# Patient Record
Sex: Female | Born: 1975
Health system: Southern US, Community
[De-identification: ages and names within clinical notes are randomized; demographics above are authoritative.]

## PROBLEM LIST (undated history)

## (undated) HISTORY — PX: ABDOMINAL HYSTERECTOMY: SHX81

---

## 2004-08-26 ENCOUNTER — Emergency Department: Payer: Self-pay | Admitting: Emergency Medicine

## 2005-11-25 ENCOUNTER — Emergency Department: Payer: Self-pay | Admitting: Emergency Medicine

## 2006-04-11 ENCOUNTER — Emergency Department: Payer: Self-pay | Admitting: Emergency Medicine

## 2007-03-09 ENCOUNTER — Emergency Department: Payer: Self-pay | Admitting: Emergency Medicine

## 2007-07-31 ENCOUNTER — Emergency Department: Payer: Self-pay | Admitting: Emergency Medicine

## 2007-11-23 ENCOUNTER — Emergency Department: Payer: Self-pay | Admitting: Emergency Medicine

## 2008-01-08 ENCOUNTER — Emergency Department: Payer: Self-pay | Admitting: Emergency Medicine

## 2008-03-15 ENCOUNTER — Emergency Department: Payer: Self-pay | Admitting: Emergency Medicine

## 2008-07-11 ENCOUNTER — Emergency Department: Payer: Self-pay | Admitting: Emergency Medicine

## 2009-05-05 ENCOUNTER — Emergency Department: Payer: Self-pay | Admitting: Emergency Medicine

## 2009-10-04 ENCOUNTER — Emergency Department: Payer: Self-pay | Admitting: Emergency Medicine

## 2010-04-03 ENCOUNTER — Emergency Department: Payer: Self-pay | Admitting: Emergency Medicine

## 2011-01-05 ENCOUNTER — Emergency Department (HOSPITAL_COMMUNITY)
Admission: EM | Admit: 2011-01-05 | Discharge: 2011-01-05 | Disposition: A | Discharge status: Home / Self Care | Payer: No Typology Code available for payment source | Attending: Emergency Medicine | Admitting: Emergency Medicine

## 2011-01-05 ENCOUNTER — Emergency Department (HOSPITAL_COMMUNITY): Payer: No Typology Code available for payment source

## 2011-01-05 DIAGNOSIS — M545 Low back pain, unspecified: Secondary | ICD-10-CM | POA: Insufficient documentation

## 2011-01-05 DIAGNOSIS — M542 Cervicalgia: Secondary | ICD-10-CM | POA: Insufficient documentation

## 2011-01-05 DIAGNOSIS — M25559 Pain in unspecified hip: Secondary | ICD-10-CM | POA: Insufficient documentation

## 2011-01-05 DIAGNOSIS — M546 Pain in thoracic spine: Secondary | ICD-10-CM | POA: Insufficient documentation

## 2011-01-05 DIAGNOSIS — N949 Unspecified condition associated with female genital organs and menstrual cycle: Secondary | ICD-10-CM | POA: Insufficient documentation

## 2011-03-20 ENCOUNTER — Emergency Department: Payer: Self-pay | Admitting: Emergency Medicine

## 2012-03-10 ENCOUNTER — Emergency Department: Payer: Self-pay | Admitting: Emergency Medicine

## 2013-01-16 ENCOUNTER — Emergency Department: Payer: Self-pay | Admitting: Emergency Medicine

## 2013-01-16 LAB — URINALYSIS, COMPLETE
Bilirubin,UR: NEGATIVE
Glucose,UR: NEGATIVE mg/dL (ref 0–75)
Ketone: NEGATIVE
Nitrite: NEGATIVE
RBC,UR: 1 /HPF (ref 0–5)
Squamous Epithelial: 16
WBC UR: 5 /HPF (ref 0–5)

## 2013-01-29 ENCOUNTER — Emergency Department: Payer: Self-pay | Admitting: Internal Medicine

## 2013-03-26 ENCOUNTER — Emergency Department: Payer: Self-pay | Admitting: Emergency Medicine

## 2013-03-26 LAB — CBC
HGB: 5.9 g/dL — ABNORMAL LOW (ref 12.0–16.0)
MCHC: 29.1 g/dL — ABNORMAL LOW (ref 32.0–36.0)
Platelet: 361 10*3/uL (ref 150–440)
RDW: 18.3 % — ABNORMAL HIGH (ref 11.5–14.5)
WBC: 5.5 10*3/uL (ref 3.6–11.0)

## 2013-03-26 LAB — COMPREHENSIVE METABOLIC PANEL
Alkaline Phosphatase: 63 U/L (ref 50–136)
Anion Gap: 4 — ABNORMAL LOW (ref 7–16)
Calcium, Total: 8.7 mg/dL (ref 8.5–10.1)
Co2: 25 mmol/L (ref 21–32)
Creatinine: 0.9 mg/dL (ref 0.60–1.30)
EGFR (African American): >60
EGFR (Non-African Amer.): >60
Glucose: 92 mg/dL (ref 65–99)
Potassium: 4 mmol/L (ref 3.5–5.1)
SGPT (ALT): 34 U/L (ref 12–78)
Total Protein: 7.5 g/dL (ref 6.4–8.2)

## 2013-03-26 LAB — WET PREP, GENITAL

## 2013-03-26 LAB — GC/CHLAMYDIA PROBE AMP

## 2013-03-26 LAB — HCG, QUANTITATIVE, PREGNANCY: Beta Hcg, Quant.: <1 m[IU]/mL — ABNORMAL LOW

## 2013-03-27 LAB — CBC WITH DIFFERENTIAL/PLATELET
Basophil %: 0.6 %
HCT: 24 % — ABNORMAL LOW (ref 35.0–47.0)
HGB: 7.5 g/dL — ABNORMAL LOW (ref 12.0–16.0)
Lymphocyte %: 20.7 %
MCH: 20.6 pg — ABNORMAL LOW (ref 26.0–34.0)
MCV: 66 fL — ABNORMAL LOW (ref 80–100)
Neutrophil #: 5 10*3/uL (ref 1.4–6.5)
Neutrophil %: 69 %
RDW: 22.3 % — ABNORMAL HIGH (ref 11.5–14.5)

## 2013-03-27 LAB — COMPREHENSIVE METABOLIC PANEL
Alkaline Phosphatase: 59 U/L (ref 50–136)
Anion Gap: 7 (ref 7–16)
BUN: 8 mg/dL (ref 7–18)
Co2: 27 mmol/L (ref 21–32)
EGFR (Non-African Amer.): >60
Glucose: 107 mg/dL — ABNORMAL HIGH (ref 65–99)
Osmolality: 280 (ref 275–301)
Potassium: 4.1 mmol/L (ref 3.5–5.1)
SGOT(AST): 18 U/L (ref 15–37)
SGPT (ALT): 29 U/L (ref 12–78)
Sodium: 141 mmol/L (ref 136–145)
Total Protein: 7.3 g/dL (ref 6.4–8.2)

## 2013-03-28 ENCOUNTER — Inpatient Hospital Stay: Payer: Self-pay | Admitting: Obstetrics and Gynecology

## 2013-03-28 LAB — CBC WITH DIFFERENTIAL/PLATELET
Lymphocyte #: 1 10*3/uL (ref 1.0–3.6)
Lymphocyte %: 17 %
MCV: 67 fL — ABNORMAL LOW (ref 80–100)
Neutrophil #: 4.2 10*3/uL (ref 1.4–6.5)
Platelet: 281 10*3/uL (ref 150–440)
RDW: 23 % — ABNORMAL HIGH (ref 11.5–14.5)

## 2013-03-29 LAB — CBC WITH DIFFERENTIAL/PLATELET
Eosinophil #: 0.3 10*3/uL (ref 0.0–0.7)
Eosinophil %: 5.2 %
HGB: 8.1 g/dL — ABNORMAL LOW (ref 12.0–16.0)
Lymphocyte #: 1.3 10*3/uL (ref 1.0–3.6)
MCHC: 32.3 g/dL (ref 32.0–36.0)
MCV: 70 fL — ABNORMAL LOW (ref 80–100)
Platelet: 276 10*3/uL (ref 150–440)
RBC: 3.61 10*6/uL — ABNORMAL LOW (ref 3.80–5.20)
WBC: 6.4 10*3/uL (ref 3.6–11.0)

## 2013-04-01 LAB — PATHOLOGY REPORT

## 2013-05-01 ENCOUNTER — Ambulatory Visit: Payer: Self-pay | Admitting: Obstetrics and Gynecology

## 2013-05-01 LAB — CBC
HCT: 31.8 % — ABNORMAL LOW (ref 35.0–47.0)
HGB: 10.3 g/dL — ABNORMAL LOW (ref 12.0–16.0)
MCHC: 32.4 g/dL (ref 32.0–36.0)
RBC: 4.24 10*6/uL (ref 3.80–5.20)
WBC: 5.4 10*3/uL (ref 3.6–11.0)

## 2013-05-01 LAB — BASIC METABOLIC PANEL
BUN: 10 mg/dL (ref 7–18)
Calcium, Total: 9.3 mg/dL (ref 8.5–10.1)
Co2: 30 mmol/L (ref 21–32)

## 2013-05-05 ENCOUNTER — Inpatient Hospital Stay: Payer: Self-pay | Admitting: Obstetrics and Gynecology

## 2013-05-06 LAB — CREATININE, SERUM: EGFR (African American): >60

## 2013-05-07 LAB — PATHOLOGY REPORT

## 2013-05-08 LAB — CBC WITH DIFFERENTIAL/PLATELET
Basophil #: 0 10*3/uL (ref 0.0–0.1)
Basophil %: 0.4 %
Eosinophil %: 4.1 %
HCT: 21 % — ABNORMAL LOW (ref 35.0–47.0)
HGB: 6.9 g/dL — ABNORMAL LOW (ref 12.0–16.0)
Lymphocyte %: 21.9 %
MCV: 78 fL — ABNORMAL LOW (ref 80–100)
Monocyte #: 0.3 x10 3/mm (ref 0.2–0.9)
Monocyte %: 6.6 %
Neutrophil #: 3.2 10*3/uL (ref 1.4–6.5)
Platelet: 230 10*3/uL (ref 150–440)

## 2013-08-25 ENCOUNTER — Emergency Department: Payer: Self-pay | Admitting: Emergency Medicine

## 2013-08-26 LAB — CBC
HGB: 11.3 g/dL — ABNORMAL LOW (ref 12.0–16.0)
MCH: 24.5 pg — ABNORMAL LOW (ref 26.0–34.0)
MCV: 76 fL — ABNORMAL LOW (ref 80–100)
Platelet: 293 10*3/uL (ref 150–440)
RBC: 4.62 10*6/uL (ref 3.80–5.20)
RDW: 19.3 % — ABNORMAL HIGH (ref 11.5–14.5)
WBC: 6 10*3/uL (ref 3.6–11.0)

## 2013-08-26 LAB — BASIC METABOLIC PANEL
Calcium, Total: 8.9 mg/dL (ref 8.5–10.1)
Co2: 26 mmol/L (ref 21–32)
Creatinine: 0.82 mg/dL (ref 0.60–1.30)
EGFR (African American): >60
EGFR (Non-African Amer.): >60
Glucose: 88 mg/dL (ref 65–99)
Osmolality: 272 (ref 275–301)
Potassium: 3.7 mmol/L (ref 3.5–5.1)

## 2014-01-25 ENCOUNTER — Encounter (HOSPITAL_COMMUNITY): Payer: Self-pay | Admitting: Emergency Medicine

## 2014-01-25 ENCOUNTER — Emergency Department (HOSPITAL_COMMUNITY): Payer: Medicaid Other

## 2014-01-25 ENCOUNTER — Emergency Department (HOSPITAL_COMMUNITY)
Admission: EM | Admit: 2014-01-25 | Discharge: 2014-01-25 | Disposition: A | Discharge status: Home / Self Care | Payer: Medicaid Other | Attending: Emergency Medicine | Admitting: Emergency Medicine

## 2014-01-25 DIAGNOSIS — M25562 Pain in left knee: Secondary | ICD-10-CM

## 2014-01-25 DIAGNOSIS — M1990 Unspecified osteoarthritis, unspecified site: Secondary | ICD-10-CM

## 2014-01-25 DIAGNOSIS — M1710 Unilateral primary osteoarthritis, unspecified knee: Secondary | ICD-10-CM | POA: Insufficient documentation

## 2014-01-25 DIAGNOSIS — IMO0002 Reserved for concepts with insufficient information to code with codable children: Secondary | ICD-10-CM | POA: Insufficient documentation

## 2014-01-25 MED ORDER — IBUPROFEN 600 MG PO TABS: 600.0000 mg | ORAL_TABLET | Freq: Four times a day (QID) | ORAL | Status: DC | PRN

## 2014-01-25 NOTE — ED Notes (Addendum)
Pt c/o lt knee pain x 1 year.  Worse x 1 month.  Denies injury.

## 2014-01-25 NOTE — ED Provider Notes (Signed)
CSN: [id number]     Arrival date & time 01/25/14  1453 History  This chart was scribed for non-physician practitioner, Christine MuttonMarissa Tanasia Budzinski, PA-C working with Christine KrasJon R Knapp, MD by Christine Li, ED scribe. This patient was seen in room WTR6/WTR6 and the patient's care was started at 5:46 PM.   Chief Complaint  Patient presents with  . Knee Pain   The history is provided by the patient. No language interpreter was used.   HPI Comments: Christine Li is a 38 y.o. female who presents to the Emergency Department complaining of gradual onset, stabbing left knee pain that started a little over one year ago. States pain worsened one month ago. Rates pain 5/10 currently. Pt states it intermittently radiates down her leg. 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 States she wears shoes with good arches. Bearing weight worsens the pain. She has tried tylenol, inserts, stretching, ice and warm compresses with little relief. Pt is also having some right knee pain but states it is not as bad as the left. Denies numbness or tingling, loss of sensation.   History reviewed. No pertinent past medical history. Past Surgical History  Procedure Laterality Date  . Abdominal hysterectomy     History reviewed. No pertinent family history. History  Substance Use Topics  . Smoking status: Never Smoker   . Smokeless tobacco: Not on file  . Alcohol Use: No   OB History   Grav Para Term Preterm Abortions TAB SAB Ect Mult Living                 Review of Systems  Musculoskeletal: Positive for arthralgias.  Neurological: Negative for numbness.  All other systems reviewed and are negative.  Allergies  Review of patient's allergies indicates no known allergies.  Home Medications   Prior to Admission medications   Not on File   BP 141/85  Pulse 101  Temp(Src) 98.2 F (36.8 C) (Oral)  Resp 16  SpO2 96%  LMP 12/31/2010  Physical Exam  Nursing note and vitals reviewed. Constitutional: She is  oriented to person, place, and time. She appears well-developed and well-nourished. No distress.  HENT:  Head: Normocephalic and atraumatic.  Eyes: Conjunctivae and EOM are normal. Right eye exhibits no discharge. Left eye exhibits no discharge.  Neck: Normal range of motion. Neck supple. No tracheal deviation present.  Cardiovascular: Normal rate, regular rhythm and normal heart sounds.   Pulses:      Radial pulses are 2+ on the right side, and 2+ on the left side.       Dorsalis pedis pulses are 2+ on the right side, and 2+ on the left side.       Posterior tibial pulses are 2+ on the right side, and 2+ on the left side.  Pulmonary/Chest: Effort normal and breath sounds normal. No respiratory distress.  Musculoskeletal: She exhibits edema and tenderness.       Legs: Mild swelling identified to the medial aspect of the left knee. Negative erythema, inflammation, lesions, sores, deformities identified. Discomfort upon palpation to the medial aspect of the left knee - following the MCL. Full range of motion-full flexion extension identified with most discomfort upon flexion of the knee. Negative posterior and anterior draw sign. Discomfort with McMurray's sign. Negative valgus and varus tension noted. Stable left knee joint.  Lymphadenopathy:    She has no cervical adenopathy.  Neurological: She is alert and oriented to person, place, and time. No cranial nerve deficit.  She exhibits normal muscle tone. Coordination normal.  Cranial nerves III-XII grossly intact Strength 5+/5+ to lower extremities bilaterally with resistance applied, equal distribution noted Strength intact to the digits of the feet bilaterally Sensation intact with differentiation to sharp and dull type Gait proper, proper balance - negative sway, negative drift, negative step-offs  Skin: Skin is warm and dry. She is not diaphoretic.  Psychiatric: She has a normal mood and affect. Her behavior is normal.    ED Course   Procedures (including critical care time)  DIAGNOSTIC STUDIES: Oxygen Saturation is 96% on RA, normal by my interpretation.    COORDINATION OF CARE: 5:53 PM-Discussed treatment plan which includes an anti-inflammatory with pt at bedside and pt agreed to plan. Will give pt orthopedic referral and advised her to follow up.  Labs Review Labs Reviewed - No data to display  Imaging Review Dg Knee Complete 4 Views Left  01/25/2014   CLINICAL DATA:  Chronic left knee pain  EXAM: LEFT KNEE - COMPLETE 4+ VIEW  COMPARISON:  None.  FINDINGS: No fracture. No bone lesion. Minimal marginal osteophytes are noted from the medial compartment. No other arthropathic change. No joint effusion. Normal soft tissues.  IMPRESSION: Minimal medial joint space compartment marginal osteophytes. No other abnormality.   Electronically Signed   By: Christine Li  Li M.D.   On: 01/25/2014 15:08     EKG Interpretation None      MDM   Final diagnoses:  Osteoarthritis  Left knee pain    Filed Vitals:   01/25/14 1507  BP: 141/85  Pulse: 101  Temp: 98.2 F (36.8 C)  TempSrc: Oral  Resp: 16  SpO2: 96%   I personally performed the services described in this documentation, which was scribed in my presence. The recorded information has been reviewed and is accurate.  Plain film of left knee identified minimal medial joint space compartment marginal osteophytes suspicion for osteoarthritis with no other acute abnormalities. Medial swelling identified to the left knee with mild discomfort upon palpation to the MCL joint line. Full flexion extension noted with decreased flexion secondary to pain. Sensation intact. Patient neurovascularly intact. Pulses palpable and strong-DP and PT. Strength intact to the digits. Patient is able to ambulate well. Negative acute abnormalities identified on plain film. Patient stable, afebrile. Patient no acute distress. Discharged patient. Patient placed in Ace wrap for comfort. Discussed  with patient educated patient on osteoarthritis. Referred patient to health and wellness Center and orthopedics. Discussed with patient weight-bearing exercises and water therapy. Discussed with patient rest, ice, elevate, massage. Discussed with patient proper insoles for her shoes. Discussed with patient to closely monitor symptoms and if symptoms are to worsen or change to report back to the ED - strict return instructions given.  Patient agreed to plan of care, understood, all questions answered.   Christine MuttonMarissa Ivan Lacher, PA-C 01/25/14 2115

## 2014-01-25 NOTE — Discharge Instructions (Signed)
Please call your doctor for a followup appointment within 24-48 hours. When you talk to your doctor please let them know that you were seen in the emergency department and have them acquire all of your records so that they can discuss the findings with you and formulate a treatment plan to fully care for your new and ongoing problems. Please call and set-up an appointment with orthopedics to be re-assessed regarding left knee pain and osteoarthritis Please wrap knee when working Please take medications on a full stomach - please no more than 2400 mg per day for this can lead to GI bleeds - please take on a full stomach Please participate in weight bearing exercises and water therapy to strengthen the muscles Please continue to monitor symptoms closely and if symptoms are to worsen or change (fever greater than 101, chills, nausea, vomiting, diarrhea, chest pain, shortness of breath, difficulty breathing, fall, injury, worsening or changes to pain pattern, numbness, tingling, loss of sensation to the leg, redness to the leg, redness to the knee, warmth upon palpation, red streaks running down the leg) please report back to the ED immediately  Osteoarthritis Osteoarthritis is a disease that causes soreness and swelling (inflammation) of a joint. It occurs when the cartilage at the affected joint wears down. Cartilage acts as a cushion, covering the ends of bones where they meet to form a joint. Osteoarthritis is the most common form of arthritis. It often occurs in older people. The joints affected most often by this condition include those in the:  Ends of the fingers.  Thumbs.  Neck.  Lower back.  Knees.  Hips. CAUSES  Over time, the cartilage that covers the ends of bones begins to wear away. This causes bone to rub on bone, producing pain and stiffness in the affected joints.  RISK FACTORS Certain factors can increase your chances of having osteoarthritis, including:  Older  age.  Excessive body weight.  Overuse of joints. SIGNS AND SYMPTOMS   Pain, swelling, and stiffness in the joint.  Over time, the joint may lose its normal shape.  Small deposits of bone (osteophytes) may grow on the edges of the joint.  Bits of bone or cartilage can break off and float inside the joint space. This may cause more pain and damage. DIAGNOSIS  Your health care provider will do a physical exam and ask about your symptoms. Various tests may be ordered, such as:  X-rays of the affected joint.  An MRI scan.  Blood tests to rule out other types of arthritis.  Joint fluid tests. This involves using a needle to draw fluid from the joint and examining the fluid under a microscope. TREATMENT  Goals of treatment are to control pain and improve joint function. Treatment plans may include:  A prescribed exercise program that allows for rest and joint relief.  A weight control plan.  Pain relief techniques, such as:  Properly applied heat and cold.  Electric pulses delivered to nerve endings under the skin (transcutaneous electrical nerve stimulation, TENS).  Massage.  Certain nutritional supplements.  Medicines to control pain, such as:  Acetaminophen.  Nonsteroidal anti-inflammatory drugs (NSAIDs), such as naproxen.  Narcotic or central-acting agents, such as tramadol.  Corticosteroids. These can be given orally or as an injection.  Surgery to reposition the bones and relieve pain (osteotomy) or to remove loose pieces of bone and cartilage. Joint replacement may be needed in advanced states of osteoarthritis. HOME CARE INSTRUCTIONS   Only take over-the-counter or prescription  medicines as directed by your health care provider. Take all medicines exactly as instructed.  Maintain a healthy weight. Follow your health care provider's instructions for weight control. This may include dietary instructions.  Exercise as directed. Your health care provider can  recommend specific types of exercise. These may include:  Strengthening exercises These are done to strengthen the muscles that support joints affected by arthritis. They can be performed with weights or with exercise bands to add resistance.  Aerobic activities These are exercises, such as brisk walking or low-impact aerobics, that get your heart pumping.  Range-of-motion activities These keep your joints limber.  Balance and agility exercises These help you maintain daily living skills.  Rest your affected joints as directed by your health care provider.  Follow up with your health care provider as directed. SEEK MEDICAL CARE IF:   Your skin turns red.  You develop a rash in addition to your joint pain.  You have worsening joint pain. SEEK IMMEDIATE MEDICAL CARE IF:  You have a significant loss of weight or appetite.  You have a fever along with joint or muscle aches.  You have night sweats. FOR MORE INFORMATION  National Institute of Arthritis and Musculoskeletal and Skin Diseases: www.niams.http://www.myers.net/nih.gov [employer] Millsational Institute on Aging: https://walker.com/www.nia.nih.gov American College of Rheumatology: www.rheumatology.org Document Released: 09/11/2005 Document Revised: 07/02/2013 Document Reviewed: 05/19/2013 [facility] Patient Information 2014 [place]Care, [place].   Emergency Department Resource Guide 1) Find a Doctor and Pay Out of Pocket Although you won't have to find out who is covered by your insurance plan, it is a good idea to ask around and get recommendations. You will then need to call the office and see if the doctor you have chosen will accept you as a new patient and what types of options they offer for patients who are self-pay. Some doctors offer discounts or will set up payment plans for their patients who do not have insurance, but you will need to ask so you aren't surprised when you get to your appointment.  2) Contact Your Local Health Department Not all health departments have  doctors that can see patients for sick visits, but many do, so it is worth a call to see if yours does. If you don't know where your local health department is, you can check in your phone book. The CDC also has a tool to help you locate your state's health department, and many state websites also have listings of all of their local health departments.  3) Find a Walk-in Clinic If your illness is not likely to be very severe or complicated, you may want to try a walk in clinic. These are popping up all over the country in pharmacies, drugstores, and shopping centers. They're usually staffed by nurse practitioners or physician assistants that have been trained to treat common illnesses and complaints. They're usually fairly quick and inexpensive. However, if you have serious medical issues or chronic medical problems, these are probably not your best option.  No Primary Care Doctor: - Call Health Connect at  [phone] - they can help you locate a primary care doctor that  accepts your insurance, provides certain services, etc. - Physician Referral Service- [phone]  Chronic Pain Problems: Organization         Address  Phone   Notes  [facility]  [phone] Patients need to be referred by their primary care doctor.   Medication Assistance: Retail buyerrganization         Address  Phone  Notes  [facility] Medication [facility] [address] [place], [place] [address] [phone] --Must be a resident of [facility] -- Must have NO insurance coverage whatsoever (no Medicaid/ Medicare, etc.) -- The pt. MUST have a primary care doctor that directs their care regularly and follows them in the community   [facility]ist  [phone]   [employer]  [phone]    Agencies that provide inexpensive medical care: Organization         Address  Phone   Notes  [place]  [phone]   [facility]    [phone]    [facility] [place], [place] [address] [phone]   [place] [address] [place] [phone]   Planned Parenthood    [phone]   [facility]    [phone]   [place] and [place] [address], [place] Phone:  [phone], Fax:  [phone] Hours of Operation:  9 am - 6 pm, M-F.  Also accepts Medicaid/Medicare and self-pay.  [facility] for [place], [address], [place] Phone: [phone], Fax: [phone]. Hours of Operation:  8:30 am - 5:30 pm, M-F.  Also accepts Medicaid and self-pay.  [facility] ([facility]) [place] [address] [place] Phone: [phone]   [place], [place], [place] [phone], Ext. [phone] Mondays & Thursdays: 7-9 AM.  First 15 patients are seen on a first come, first serve basis.    [place] Providers:  Organization         Address  Phone   Notes  [facility] [address],  [phone] Also accepts self-pay patients.  [facility] [address], [place]  [phone]   [place], [address], [place] [phone]   [facility] [address] [place] [address] [place] [phone]   Lucianne Lei [address], [place]   [phone] Only accepts [place] Access [place] patients after they have their name applied to their card.   Self-Pay (no insurance) in [facility]:  Organization         Address  Phone   Notes  Sickle Cell Patients, [facility] [place] [phone]   [facility] [place] [phone]   [facility] [place]  [place], [place], [place] [phone]   [facility]/Dr. Osei-Bonsu  [address], [place] or [address], [place] [phone] Phone number for both [place] and [place] locations is the same.  Urgent Medical and [facility] [address] [place] [phone]   [facility] [address], [place] or [address] [phone]   [facility] [address], [place] [phone], phone; [phone], fax Sees patients 1st and 3rd Saturday of every month.  Must not qualify for public or private insurance (i.e. Medicaid, Medicare, [place] Health Choice, Veterans' Benefits)  Household income should be no more than 200% of the poverty level The clinic cannot treat you if you are pregnant or think you are pregnant  Sexually transmitted  diseases are not treated at the clinic.    Dental Care: Organization         Address  Phone  Notes  [facility] Department of [facility] [place] [phone] Accepts children up to age 64 who are enrolled in [place] or [place]; pregnant women with a Medicaid card; and children who have applied for Medicaid or [place] Health Choice, but were declined, whose parents can pay a reduced fee at time of service.  [facility] Department of [facility]  [address], [place] [phone] Accepts children up to age 70 who are enrolled in [place] or [place]; pregnant women with a Medicaid card; and children who have applied for Medicaid or [place] Health Choice, but were declined, whose parents can pay a reduced fee at time of service.  [place] Adult Dental Access PROGRAM  [place] [phone] Patients are seen by appointment only. Walk-ins are not accepted. [place] will see patients 24 years of age and older. Monday - Tuesday (8am-5pm) Most Wednesdays (8:30-5pm) $30 per visit, cash only  [facility]  Adult Dental Access PROGRAM  [address], [facility] [phone] Patients are seen by appointment only. Walk-ins are not accepted. [place] will see patients 68 years of age and older. One Wednesday Evening (Monthly: Volunteer Based).  $30 per visit, cash only  [place]  [phone] for adults; Children under age 13, call Graduate Pediatric Dentistry at [phone]. Children aged 30-14, please call [phone] to request a pediatric application.  Dental services are provided in all areas of dental care including fillings, crowns and bridges, complete and partial dentures, implants, gum treatment, root canals, and extractions. Preventive care is also provided. Treatment is provided to both adults and children. Patients are selected via a lottery and there is often a waiting list.   [facility] [address], [place]  [phone] www.drcivils.com   [facility] [address] [place], [place] [phone], Ext. [phone] Second and Fourth Thursday of each month, opens at 6:30 AM; Clinic ends at 9 AM.  Patients are seen on a first-come first-served basis, and a limited number are seen during each clinic.   [facility]  [address] Hillard Danker [place], [place] [phone]   Eligibility Requirements You must have lived in [place], [place], or [place] counties for at least the last three months.   You cannot be eligible for state or federal sponsored [employer], including [employer], [place], or [employer].   You generally cannot be eligible for healthcare insurance through your employer.    How to apply: Eligibility screenings are held every Tuesday and Wednesday afternoon from 1:00 pm until 4:00 pm. You do not need an appointment for the interview!  [facility] [address] [place], [place]   [place]  [place] Department  [place]  [phone]    Behavioral Health Resources in the Community: Intensive Outpatient Programs Organization         Address  Phone  Notes  [place]. [address], [place], [place] [phone]   [facility] [address], [place], [place]   ADS: Alcohol & Drug Svcs [address] [place], [place]  [place] [address],  [place], [place] or [phone]   Substance Abuse Resources Organization         Address  Phone  Notes  Alcohol and Drug Services  [phone]   [place]  [phone]   [facility] [place]   Chinita Pester  [phone]   Residential & Outpatient Substance Abuse Program  [phone]   Psychological Services Organization         Address  Phone  Notes  [facility]  [place]  [phone]   [place] [address] [place] or [phone]    Mobile Crisis Teams Organization         Address  Phone  Notes  [facility], Mobile Crisis Care Unit  [phone]   Assertive Psychotherapeutic Services  [address] [place], [place]   [place] [address], [place] [phone]    Self-Help/Support Groups Organization         Address  Phone             Notes  [place]. of [place] - variety of support groups  [place] Call for more information  Narcotics Anonymous (NA), [facility] [address], [employer] [place]  2 meetings at this location   Special educational needs teacher         Address  Phone  Notes  ASAP Residential Treatment [place],    [place]  [phone]   [facility]  [address], [place] [phone], [place], [place]   [place], [place] [phone] Admissions: 8am-3pm M-F  Incentives Substance [place] [address],    [place], [place] [phone]   [facility] [address] [place], [place], [place]   [facility] [address],  [place], [place]   [facility] Programs - Intensive Outpatient [address], [place], [place]   [facility] ([place].) [place].,  [place], [place] [phone] or [phone]   [facility] Services (RTS) [address] [place], [place] Accepts Medicaid  [facility] [place] [address],  [place] [phone] Substance Abuse/Addiction Treatment   [facility] Organization         Address  Phone  Notes  [facility]  [phone]   Domenic Schwab, PhD [address] Arlis Port[place], [place]   [phone] or [phone]   [place], [place] [phone]   [facility] [address] [place], [place] [phone] Insurance/Medicaid/sponsorship through [facility] and Families [address], [address] [place]                                    [place], [place] [phone] [place] [address], [place] [phone]    Dr. Adele Schilder  [phone]   [facility] Dept. 1) [address],  2) [place] 3)  [place] [address], [place] [phone]  [phone]   [place] [phone] or [phone] (After Hours)

## 2014-01-25 NOTE — ED Provider Notes (Signed)
Medical screening examination/treatment/procedure(s) were performed by non-physician practitioner and as supervising physician I was immediately available for consultation/collaboration.    Celene KrasJon R Izaha Shughart, MD 01/25/14 [phone]
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
## 2015-01-15 NOTE — Op Note (Signed)
PATIENT NAME:  Christine Li, Christine Li MR#:  [id number] DATE OF BIRTH:  27-Feb-1976  DATE OF PROCEDURE:  03/27/2013  PREOPERATIVE DIAGNOSES: 1.  Menorrhagia to anemia.  2.  Abdominal pain.  3.  Uterine fibroids.   POSTOPERATIVE DIAGNOSES: 1.  Menorrhagia to anemia.  2.  Uterine fibroids, 22 weeks size.  3.  Abdominal pain.   OPERATIVE PROCEDURES: 1.  Hysteroscopy.  2.  Dilation and curettage of endometrium.   SURGEON: Herold HarmsMartin A DeFrancesco, MD   FIRST ASSISTANT: Lanier EnsignMatt Reinblatt, PA student   ANESTHESIA: General.   INDICATIONS: The patient is a 39 year old African American female para 0-1-7-1, uncertain last menstrual period, status post BTL for contraception, who presents for surgical evaluation and management of multifibroid uterus with menorrhagia to anemia. The patient was admitted to the Emergency Room following a 2-unit transfusion yesterday. She continues to bleed and have pain.   FINDINGS AT SURGERY: Revealed a multifibroid uterus that measured approximately 22 weeks size with a right-sided prominence. The uterus sounded to 11 cm. The uterus was mobile. On hysteroscopy, the cavity was distorted due to multiple nodular fibroids likely consistent with submucosal fibroids. A moderate amount of bleeding was encountered.   DESCRIPTION OF PROCEDURE: The patient was brought to the operating room where she was placed in the supine position. General anesthesia was induced without difficulty. She was placed in dorsal lithotomy position using candy cane stirrups. A Betadine perineal intravaginal prep and drape was performed in the standard fashion. A red Robinson catheter was used to drain 100 mL of urine from the bladder. A weighted speculum was placed into the vagina, and a double-tooth tenaculum was placed in the anterior lip of the cervix. The uterus was sounded to 11 cm. The Hanks dilators up to a #20-French caliber were used to dilate the endocervical canal. The [facility] hysteroscope using lactated  Ringer's as irrigant was used to attempt identification of the intrauterine environment. The cavity was distorted then, and no normal findings could be identified. A mild-to-moderate amount of bleeding obscured the field as well.   The curettage was then performed with both smooth and serrated curettes. Average tissue was obtained. Upon completion of the procedure, repeat hysteroscopy was performed, and again poor visualization was noted. The procedure was terminated with all instrumentation being removed from the vagina. The patient was then awakened, mobilized, and taken to the recovery room in satisfactory condition.   ESTIMATED BLOOD LOSS: 100 mL.  COMPLICATIONS:  None.   COUNTS:  All instruments, needle, and sponge counts were verified as correct.   ____________________________ Prentice DockerMartin A. DeFrancesco, MD mad:cb D: 03/27/2013 15:10:05 ET T: 03/27/2013 20:40:19 ET JOB#: [id number]  cc: Daphine DeutscherMartin A. DeFrancesco, MD, <Dictator> Prentice DockerMARTIN A DEFRANCESCO MD ELECTRONICALLY SIGNED 04/06/2013 22:58

## 2015-01-15 NOTE — Op Note (Signed)
PATIENT NAME:  Christine Li, Christine Li MR#:  [id number] DATE OF BIRTH:  01-10-1976  DATE OF PROCEDURE:  05/05/2013  PREOPERATIVE DIAGNOSES:  1.  Central pelvic mass.  2.  Menorrhagia.   OPERATIVE PROCEDURE:  Total abdominal hysterectomy and bilateral salpingectomy.   SURGEON: Herold Harms, M.D.   FIRST ASSISTANT: Earlie Lou, FNP.   ANESTHESIA: General endotracheal.   INDICATIONS: The patient is a 39 year old African American female, para 0-1-7-1, status post BTL, with a long history of menorrhagia to anemia requiring multiple blood transfusions, presents for surgical management. The patient did not undergo Lupron therapy for reduction in size due to cost. The patient desired definitive surgery.   FINDINGS AT SURGERY: A 23-week-sized multifibroid uterus. The ovaries were normal bilaterally. Tubes were normal, but there were adhesions that prompted bilateral salpingectomy.   DESCRIPTION OF PROCEDURE: The patient was brought to the operating room where she was placed in the supine position. General endotracheal anesthesia was induced without difficulty. A ChloraPrep and Betadine abdominal, perineal, intravaginal prep and drape was performed in the standard fashion. A Foley catheter was placed and was draining clear yellow urine.   A midline incision was made in the abdomen from the subxiphoid region around the umbilicus to the suprapubic region. This was extended to the fascia which was incised. The fascia was incised and the peritoneum was entered. The abdomen was explored and was notable for the above findings. The uterus could not be adequately mobilized and delivered from the pelvis and the hysterectomy had to be performed in situ. The size of the uterus precluded placement of the Balfour retractor. Using Richardson retractors, the hysterectomy was performed in somewhat standard fashion. The round ligaments were clamped, cut, and stick-tied using 0 Vicryl suture. This was done  bilaterally. The posterior leaf and anterior leaf of the broad ligaments were opened. The utero-ovarian ligaments were then undermined and doubly clamped and cut. These were stick-tied using 0 Vicryl suture. This was done bilaterally. The pedicle on the left was large and very vascular and this had to be taken in several bites. Similarly, the procedure was done on the right side as well. The broad ligament was skeletonized and the uterine arteries were doubly clamped and cut and stick-tied. Several other pedicles were taken on both sides, respectively, before the uterus could be amputated from the lower uterine segment. This was done with a scalpel.   This facilitated further exposure into the pelvis and the Balfour retractor was then able to be placed to provide adequate exposure. The remainder of the procedure was then performed in a sequential manner as the cardinal and broad ligament complexes were clamped, cut, and stick-tied using 0 Vicryl suture. This was carried down to the depths of the pelvis. The cervix was very long. Eventually the cervicovaginal junction was crossclamped, cut, and the cervix was removed from the operative field. The angles were closed using 0 Vicryl suture in a Richardson stitch technique. The vagina was closed with figure-of-eight sutures of 0 Vicryl.   Copious irrigation of the pelvis was performed. Hemostasis was noted to be good. Bilateral tubes were then removed because of adhesions from previous salpingectomies. The medial salpinx were crossclamped and cut and tied off using 0 Vicryl suture. Upon completion of the bilateral salpingectomies and further assessment of adequacy of hemostasis, the abdomen was closed in layers using 0 Maxon on the fascia in a simple running manner. Subcutaneous tissues were reapproximated using 0 Vicryl suture. The skin was closed with staples. A pressure  dressing was applied. The patient was then mobilized, awakened, and taken to the recovery room  in satisfactory condition.   ESTIMATED BLOOD LOSS: Was 500 mL.   IV FLUIDS: Two liters.   URINE OUTPUT: Was 90 mL.   The patient did receive Ancef antibiotic prophylaxis. The patient also had subQ heparin DVT prophylaxis as well.    ____________________________ Christine DockerMartin A. Erynn Vaca, MD mad:np D: 05/05/2013 20:04:23 ET T: 05/05/2013 20:56:44 ET JOB#: [id number]  cc: Christine DeutscherMartin A. Ibraheem Voris, MD, <Dictator> Christine DockerMARTIN A Jaselynn Tamas MD ELECTRONICALLY SIGNED 05/12/2013 8:30

## 2015-05-27 ENCOUNTER — Emergency Department (HOSPITAL_COMMUNITY)
Admission: EM | Admit: 2015-05-27 | Discharge: 2015-05-27 | Disposition: A | Discharge status: Home / Self Care | Payer: Medicaid Other

## 2015-05-27 NOTE — ED Notes (Signed)
Pt not in room to triage.

## 2015-05-27 NOTE — ED Notes (Signed)
Pt not in triage room, not in waiting area, not in bathrooms.

## 2015-05-27 NOTE — ED Notes (Signed)
Pt unable to be located 

## 2015-06-07 ENCOUNTER — Encounter: Payer: Self-pay | Admitting: Emergency Medicine

## 2015-06-07 ENCOUNTER — Emergency Department
Admission: EM | Admit: 2015-06-07 | Discharge: 2015-06-07 | Disposition: A | Discharge status: Home / Self Care | Payer: No Typology Code available for payment source | Attending: Emergency Medicine | Admitting: Emergency Medicine

## 2015-06-07 DIAGNOSIS — Y92410 Unspecified street and highway as the place of occurrence of the external cause: Secondary | ICD-10-CM | POA: Insufficient documentation

## 2015-06-07 DIAGNOSIS — Y998 Other external cause status: Secondary | ICD-10-CM | POA: Insufficient documentation

## 2015-06-07 DIAGNOSIS — S3992XA Unspecified injury of lower back, initial encounter: Secondary | ICD-10-CM | POA: Diagnosis present

## 2015-06-07 DIAGNOSIS — Y9389 Activity, other specified: Secondary | ICD-10-CM | POA: Diagnosis not present

## 2015-06-07 DIAGNOSIS — S39012A Strain of muscle, fascia and tendon of lower back, initial encounter: Secondary | ICD-10-CM | POA: Diagnosis not present

## 2015-06-07 MED ORDER — IBUPROFEN 800 MG PO TABS
800.0000 mg | ORAL_TABLET | Freq: Once | ORAL | Status: AC
  Administered 2015-06-07: 800 mg via ORAL
  Filled 2015-06-07: qty 1

## 2015-06-07 MED ORDER — BACLOFEN 10 MG PO TABS: 10.0000 mg | ORAL_TABLET | Freq: Three times a day (TID) | ORAL | Status: DC

## 2015-06-07 MED ORDER — BUTALBITAL-APAP-CAFFEINE 50-325-40 MG PO TABS
1.0000 | ORAL_TABLET | Freq: Once | ORAL | Status: AC
  Administered 2015-06-07: 1 via ORAL
  Filled 2015-06-07: qty 1

## 2015-06-07 MED ORDER — IBUPROFEN 800 MG PO TABS: 800.0000 mg | ORAL_TABLET | Freq: Three times a day (TID) | ORAL | Status: AC | PRN
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
## 2015-06-07 NOTE — Discharge Instructions (Signed)

## 2015-09-08 ENCOUNTER — Emergency Department: Payer: Medicaid Other

## 2015-09-08 ENCOUNTER — Encounter: Payer: Self-pay | Admitting: Emergency Medicine

## 2015-09-08 ENCOUNTER — Emergency Department
Admission: EM | Admit: 2015-09-08 | Discharge: 2015-09-08 | Disposition: A | Discharge status: Home / Self Care | Payer: Medicaid Other | Attending: Emergency Medicine | Admitting: Emergency Medicine

## 2015-09-08 DIAGNOSIS — Z79899 Other long term (current) drug therapy: Secondary | ICD-10-CM | POA: Insufficient documentation

## 2015-09-08 DIAGNOSIS — Y998 Other external cause status: Secondary | ICD-10-CM | POA: Insufficient documentation

## 2015-09-08 DIAGNOSIS — S6000XA Contusion of unspecified finger without damage to nail, initial encounter: Secondary | ICD-10-CM

## 2015-09-08 DIAGNOSIS — S60031A Contusion of right middle finger without damage to nail, initial encounter: Secondary | ICD-10-CM | POA: Insufficient documentation

## 2015-09-08 DIAGNOSIS — X58XXXA Exposure to other specified factors, initial encounter: Secondary | ICD-10-CM | POA: Insufficient documentation

## 2015-09-08 DIAGNOSIS — Y9289 Other specified places as the place of occurrence of the external cause: Secondary | ICD-10-CM | POA: Insufficient documentation

## 2015-09-08 DIAGNOSIS — S6991XA Unspecified injury of right wrist, hand and finger(s), initial encounter: Secondary | ICD-10-CM

## 2015-09-08 DIAGNOSIS — Y9389 Activity, other specified: Secondary | ICD-10-CM | POA: Insufficient documentation
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
## 2015-09-08 NOTE — ED Notes (Signed)
Pt arrived to the ED for a laceration to the 3rd digit of the right hand. Pt is AOx4 in no apparent distress.

## 2015-09-08 NOTE — Discharge Instructions (Signed)

## 2015-09-09 ENCOUNTER — Encounter: Payer: Self-pay | Admitting: Physician Assistant

## 2015-09-09 ENCOUNTER — Ambulatory Visit: Payer: Self-pay | Admitting: Physician Assistant

## 2015-09-09 VITALS — BP 130/92 | HR 76 | Temp 98.7°F

## 2015-09-09 DIAGNOSIS — L03019 Cellulitis of unspecified finger: Secondary | ICD-10-CM

## 2015-09-09 MED ORDER — SULFAMETHOXAZOLE-TRIMETHOPRIM 800-160 MG PO TABS: 1.0000 | ORAL_TABLET | Freq: Two times a day (BID) | ORAL | Status: AC
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
## 2015-09-29 MED FILL — XENICAL 120 MG CAPSULE: 120 | 30 days supply | Qty: 90 | Fill #0

## 2015-10-13 DIAGNOSIS — R41840 Attention and concentration deficit: Secondary | ICD-10-CM | POA: Diagnosis not present

## 2015-10-13 DIAGNOSIS — H93299 Other abnormal auditory perceptions, unspecified ear: Secondary | ICD-10-CM | POA: Diagnosis not present

## 2015-10-13 DIAGNOSIS — Z79899 Other long term (current) drug therapy: Secondary | ICD-10-CM | POA: Diagnosis not present

## 2015-10-13 DIAGNOSIS — R480 Dyslexia and alexia: Secondary | ICD-10-CM | POA: Diagnosis not present

## 2015-10-20 MED FILL — VYVANSE 60 MG CAPSULE: 60 | 30 days supply | Qty: 30 | Fill #0

## 2015-11-08 MED FILL — XENICAL 120 MG CAPSULE: 120 | 30 days supply | Qty: 90 | Fill #0

## 2015-11-15 MED FILL — VALACYCLOVIR HCL 500 MG TAB: 500 | 30 days supply | Qty: 30 | Fill #3

## 2015-12-20 MED FILL — VALACYCLOVIR HCL 500 MG TAB: 500 | 30 days supply | Qty: 30 | Fill #4

## 2015-12-21 MED FILL — MELOXICAM 7.5 MG TABLET: 7.5 | 30 days supply | Qty: 60 | Fill #1

## 2016-01-03 ENCOUNTER — Ambulatory Visit: Payer: No Typology Code available for payment source | Attending: Internal Medicine | Admitting: Audiology

## 2016-02-15 MED FILL — VALACYCLOVIR HCL 500 MG TAB: 500 | 30 days supply | Qty: 30 | Fill #5
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
